# Patient Record
Sex: Male | Born: 2000 | Race: White | Hispanic: No | Marital: Single | Smoking: Never smoker
Health system: Southern US, Community
[De-identification: ages and names within clinical notes are randomized; demographics above are authoritative.]

## PROBLEM LIST (undated history)

## (undated) DIAGNOSIS — S83279A Complex tear of lateral meniscus, current injury, unspecified knee, initial encounter: Secondary | ICD-10-CM

## (undated) DIAGNOSIS — T7840XA Allergy, unspecified, initial encounter: Secondary | ICD-10-CM

## (undated) HISTORY — PX: NO PAST SURGERIES: SHX2092

---
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
## 2019-05-21 IMAGING — MR MR KNEE*R* W/O CM
6 series · 36 of 40 positions shown · non-contrast
Comparison: None.

CLINICAL DATA: Injured knee playing football 2 weeks ago.
Persistent pain.

EXAM:
MRI OF THE RIGHT KNEE WITHOUT CONTRAST
TECHNIQUE: Multiplanar, multisequence MR imaging of the knee was performed. No
intravenous contrast was administered.

[Series 3: PD fat-sat · axial · 3.0mm · 0.53mm/px · z∈[-60,+85]mm · 6 of 45 slices shown (1 of 4)]
[im 1/45]
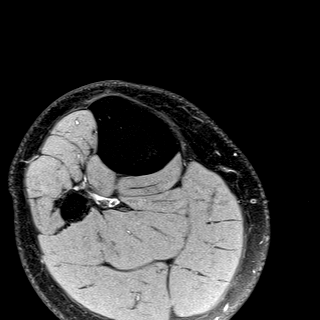
[im 9/45]
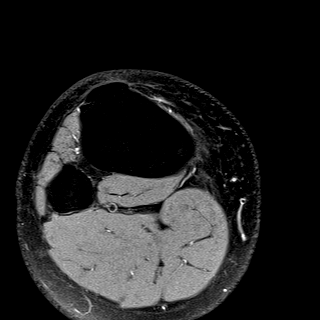
[im 18/45]
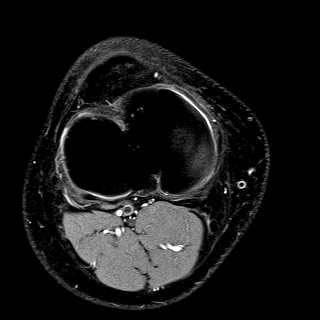
[im 27/45]
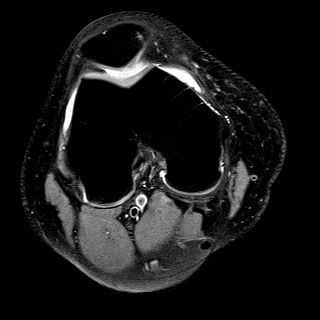
[im 36/45]
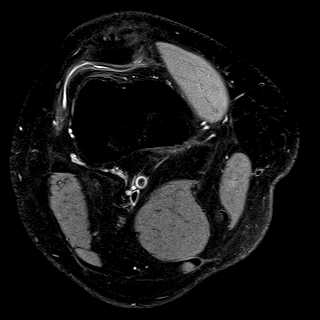
[im 45/45]
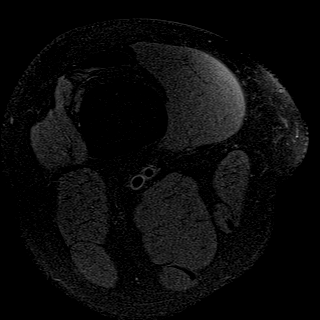

[Series 4: T1 · coronal · 3.0mm · 0.52mm/px · 4 of 48 slices shown]
[im 1/48]
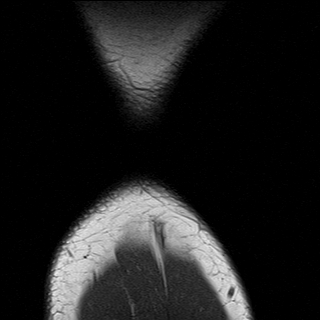
[im 7/48]
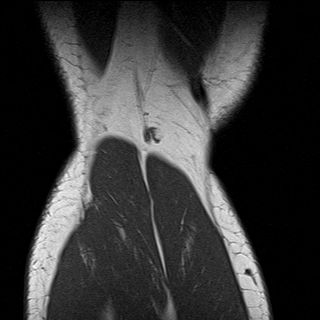
[im 14/48]
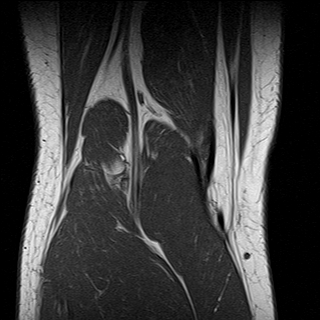
[im 21/48]
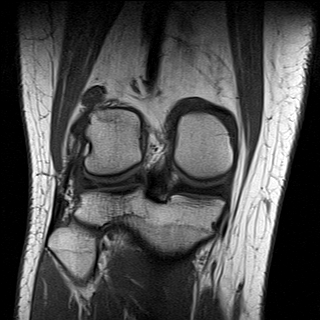

[Series 5: PD fat-sat · sagittal · 3.0mm · 0.52mm/px · 6 of 36 slices shown (2 of 4)]
[im 1/36]
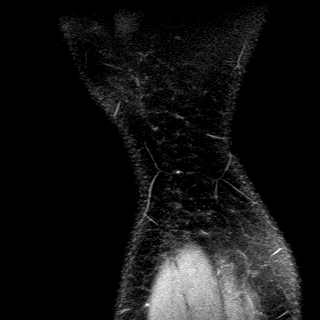
[im 8/36]
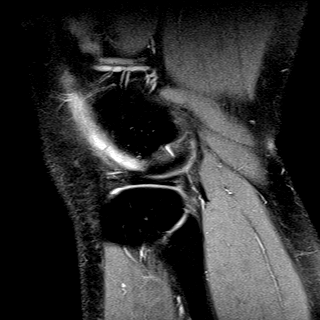
[im 15/36]
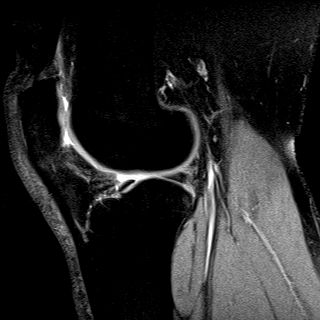
[im 22/36]
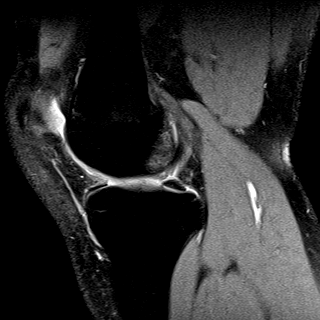
[im 29/36]
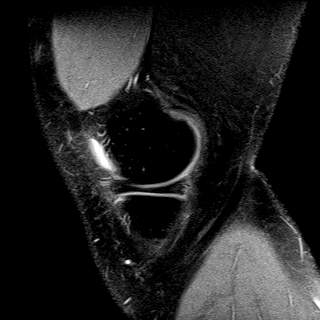
[im 36/36]
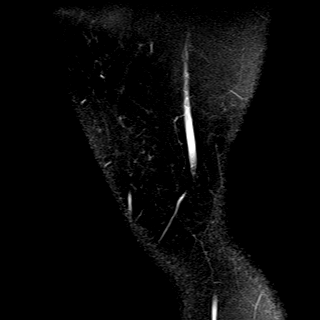

[Series 6: T2 fat-sat · coronal · 3.0mm · 0.32mm/px · 8 of 48 slices shown]
[im 1/48]
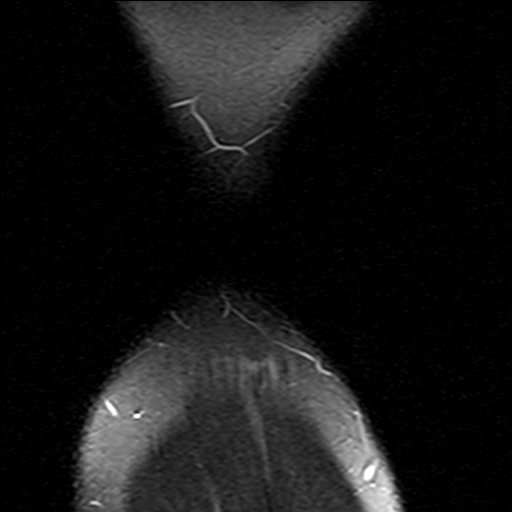
[im 7/48]
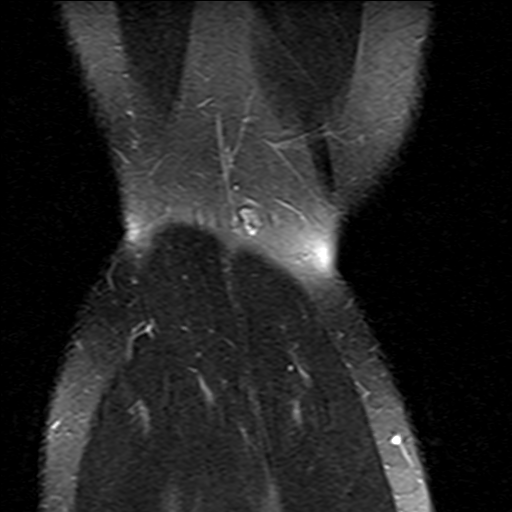
[im 14/48]
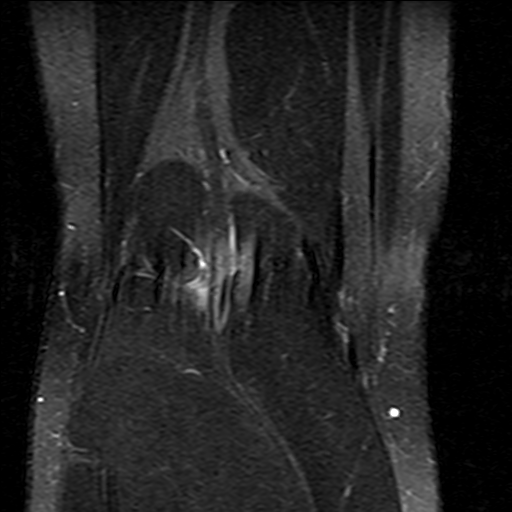
[im 21/48]
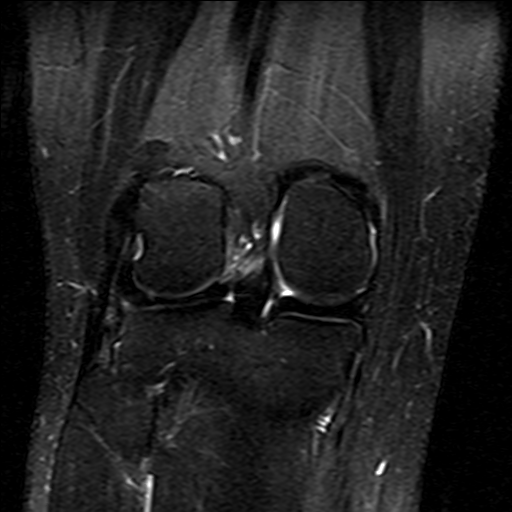
[im 27/48]
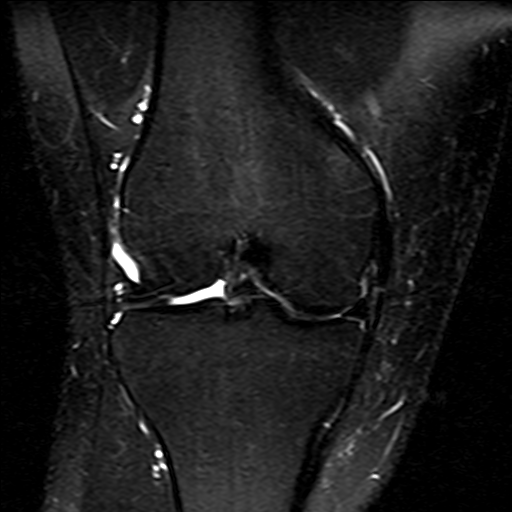
[im 34/48]
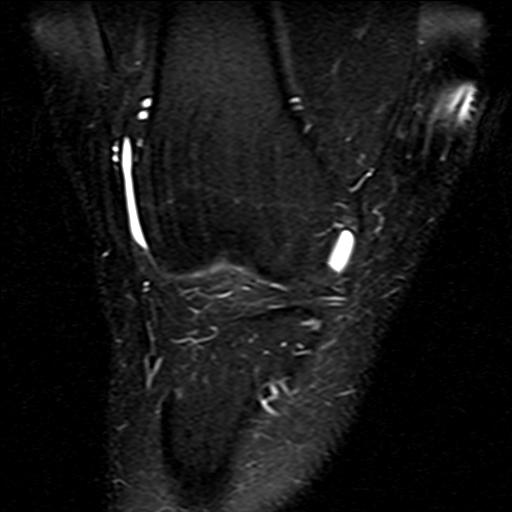
[im 41/48]
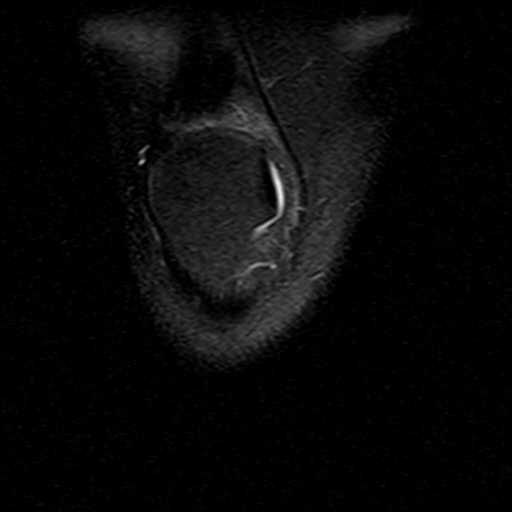
[im 48/48]
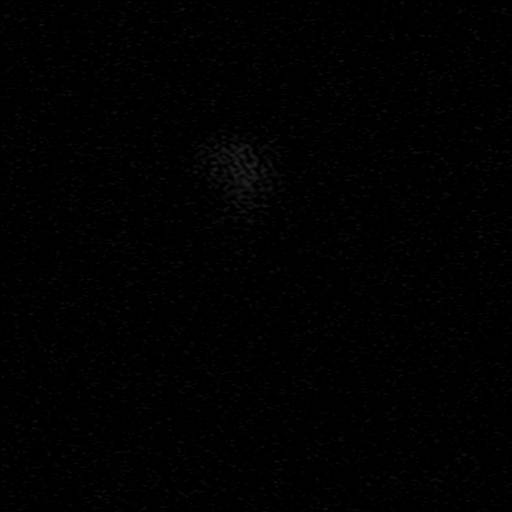

[Series 7: PD fat-sat · coronal · 3.0mm · 0.52mm/px · 8 of 48 slices shown (3 of 4)]
[im 1/48]
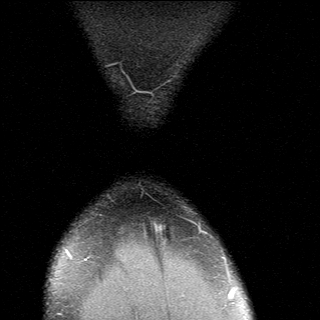
[im 7/48]
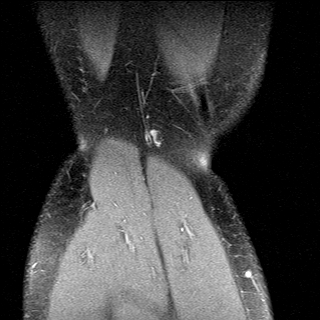
[im 14/48]
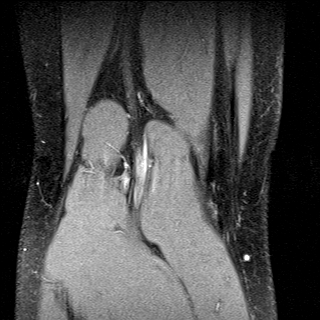
[im 21/48]
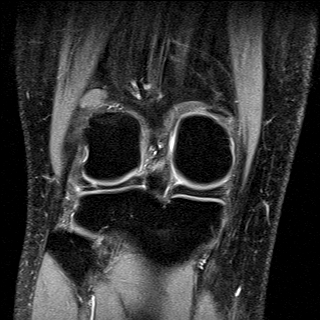
[im 27/48]
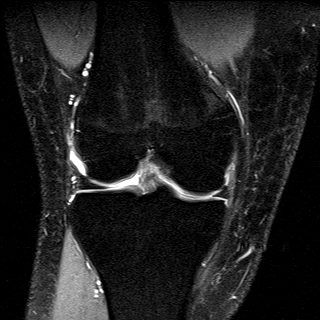
[im 34/48]
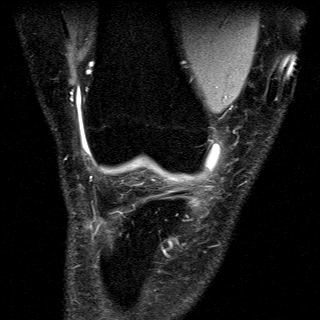
[im 41/48]
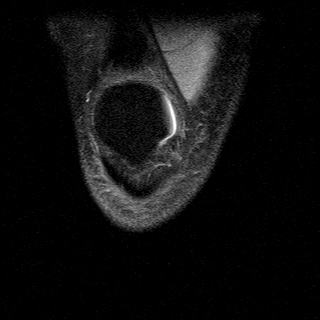
[im 48/48]
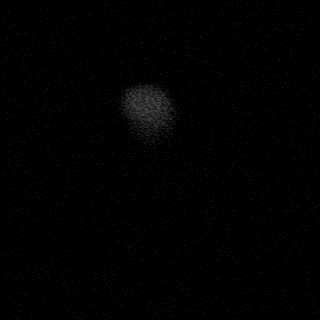

[Series 8: PD fat-sat · coronal · 2.0mm · 0.62mm/px · 4 of 27 slices shown (4 of 4)]
[im 1/27]
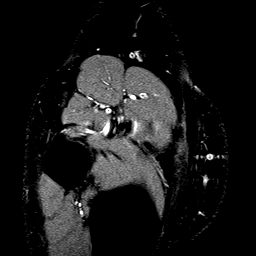
[im 9/27]
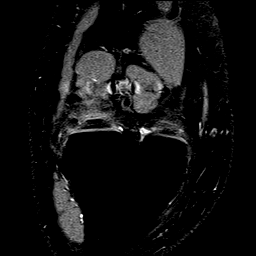
[im 18/27]
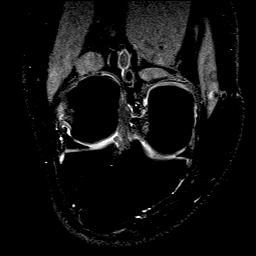
[im 27/27]
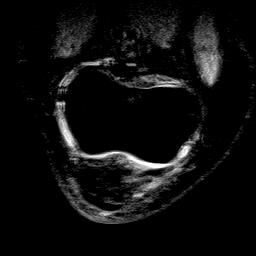

[36 of 40 positions shown; findings below may reference images not displayed]

FINDINGS: MENISCI

Medial meniscus:  Intact

Lateral meniscus: Focal complex tear involving the posterior horn
mid body junction region with both longitudinal and horizontal
components.

LIGAMENTS

Cruciates:  Intact

Collaterals:  Intact

CARTILAGE

Patellofemoral:  Normal

Medial:  Normal

Lateral:  Normal

Joint:  Small joint effusion and mild synovitis.

Popliteal Fossa:  No popliteal mass or Baker's cyst.

Extensor Mechanism: The patella retinacular structures are intact
quadriceps and patellar tendons are intact.

Bones:  No acute bony findings.

Other: Normal knee musculature.
IMPRESSION: 1. Lateral meniscus tear as discussed above.
2. Intact ligamentous structures and no acute bony findings.
3. Intact articular cartilage.
4. Small joint effusion and mild synovitis.
